# Patient Record
Sex: Female | Born: 1955 | Race: White | Hispanic: No | Marital: Single | Smoking: Never smoker
Health system: Southern US, Community
[De-identification: ages and names within clinical notes are randomized; demographics above are authoritative.]

## PROBLEM LIST (undated history)

## (undated) DIAGNOSIS — E785 Hyperlipidemia, unspecified: Secondary | ICD-10-CM

## (undated) HISTORY — PX: CYSTECTOMY: SUR359

## (undated) HISTORY — DX: Hyperlipidemia, unspecified: E78.5

---

## 1999-10-27 ENCOUNTER — Encounter: Payer: Self-pay | Admitting: Gynecology

## 1999-10-27 ENCOUNTER — Encounter: Admission: RE | Admit: 1999-10-27 | Discharge: 1999-10-27 | Payer: Self-pay | Admitting: Gynecology

## 2000-10-08 ENCOUNTER — Other Ambulatory Visit: Admission: RE | Admit: 2000-10-08 | Discharge: 2000-10-08 | Payer: Self-pay | Admitting: Gynecology

## 2000-10-31 ENCOUNTER — Encounter: Admission: RE | Admit: 2000-10-31 | Discharge: 2000-10-31 | Payer: Self-pay | Admitting: Gynecology

## 2000-10-31 ENCOUNTER — Encounter: Payer: Self-pay | Admitting: Gynecology

## 2001-10-14 ENCOUNTER — Other Ambulatory Visit: Admission: RE | Admit: 2001-10-14 | Discharge: 2001-10-14 | Payer: Self-pay | Admitting: Gynecology

## 2001-11-05 ENCOUNTER — Encounter: Admission: RE | Admit: 2001-11-05 | Discharge: 2001-11-05 | Payer: Self-pay | Admitting: Gynecology

## 2001-11-05 ENCOUNTER — Encounter: Payer: Self-pay | Admitting: Gynecology

## 2002-01-02 ENCOUNTER — Other Ambulatory Visit: Admission: RE | Admit: 2002-01-02 | Discharge: 2002-01-02 | Payer: Self-pay | Admitting: Gynecology

## 2002-07-07 ENCOUNTER — Other Ambulatory Visit: Admission: RE | Admit: 2002-07-07 | Discharge: 2002-07-07 | Payer: Self-pay | Admitting: Gynecology

## 2003-01-15 ENCOUNTER — Other Ambulatory Visit: Admission: RE | Admit: 2003-01-15 | Discharge: 2003-01-15 | Payer: Self-pay | Admitting: Gynecology

## 2003-02-10 ENCOUNTER — Encounter: Payer: Self-pay | Admitting: Gynecology

## 2003-02-10 ENCOUNTER — Encounter: Admission: RE | Admit: 2003-02-10 | Discharge: 2003-02-10 | Payer: Self-pay | Admitting: Gynecology

## 2003-02-18 ENCOUNTER — Other Ambulatory Visit: Admission: RE | Admit: 2003-02-18 | Discharge: 2003-02-18 | Payer: Self-pay | Admitting: Gynecology

## 2004-02-04 ENCOUNTER — Other Ambulatory Visit: Admission: RE | Admit: 2004-02-04 | Discharge: 2004-02-04 | Payer: Self-pay | Admitting: Gynecology

## 2004-03-16 ENCOUNTER — Encounter: Admission: RE | Admit: 2004-03-16 | Discharge: 2004-03-16 | Payer: Self-pay | Admitting: Gynecology

## 2004-10-13 ENCOUNTER — Other Ambulatory Visit: Admission: RE | Admit: 2004-10-13 | Discharge: 2004-10-13 | Payer: Self-pay | Admitting: Gynecology

## 2005-03-30 ENCOUNTER — Other Ambulatory Visit: Admission: RE | Admit: 2005-03-30 | Discharge: 2005-03-30 | Payer: Self-pay | Admitting: Gynecology

## 2005-05-04 ENCOUNTER — Encounter: Admission: RE | Admit: 2005-05-04 | Discharge: 2005-05-04 | Payer: Self-pay | Admitting: Gynecology

## 2006-04-10 ENCOUNTER — Other Ambulatory Visit: Admission: RE | Admit: 2006-04-10 | Discharge: 2006-04-10 | Payer: Self-pay | Admitting: Gynecology

## 2006-05-09 ENCOUNTER — Encounter: Admission: RE | Admit: 2006-05-09 | Discharge: 2006-05-09 | Payer: Self-pay | Admitting: Gynecology

## 2007-05-13 ENCOUNTER — Encounter: Admission: RE | Admit: 2007-05-13 | Discharge: 2007-05-13 | Payer: Self-pay | Admitting: Gynecology

## 2007-05-14 ENCOUNTER — Other Ambulatory Visit: Admission: RE | Admit: 2007-05-14 | Discharge: 2007-05-14 | Payer: Self-pay | Admitting: Gynecology

## 2008-05-26 ENCOUNTER — Encounter: Admission: RE | Admit: 2008-05-26 | Discharge: 2008-05-26 | Payer: Self-pay | Admitting: Gynecology

## 2008-06-02 ENCOUNTER — Emergency Department (HOSPITAL_COMMUNITY): Admission: EM | Admit: 2008-06-02 | Discharge: 2008-06-02 | Payer: Self-pay | Admitting: Emergency Medicine

## 2009-06-10 ENCOUNTER — Encounter: Admission: RE | Admit: 2009-06-10 | Discharge: 2009-06-10 | Payer: Self-pay | Admitting: Gynecology

## 2009-12-16 ENCOUNTER — Encounter: Admission: RE | Admit: 2009-12-16 | Discharge: 2009-12-16 | Payer: Self-pay | Admitting: Internal Medicine

## 2010-07-06 ENCOUNTER — Encounter: Admission: RE | Admit: 2010-07-06 | Discharge: 2010-07-06 | Payer: Self-pay | Admitting: Gynecology

## 2010-08-16 IMAGING — CT CT HEAD W/O CM
2 series · 16 of 30 positions shown, 18 images · non-contrast
Comparison: None

CLINICAL DATA: Dizziness.  Head injury.

CT HEAD WITHOUT CONTRAST
TECHNIQUE: Contiguous axial images were obtained from the base of
the skull through the vertex without contrast.

[Series 2: head w/o · axial · non-contrast · 0.43mm/px · z∈[+178,+292]mm · 8 of 30 slices shown, 10 images]
[im 4/30  brain]
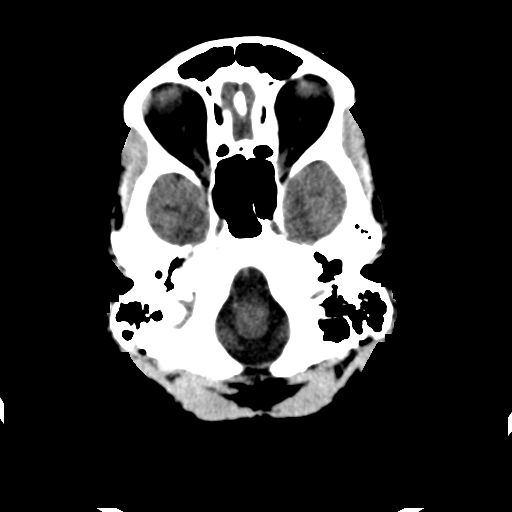
[im 4/30  bone]
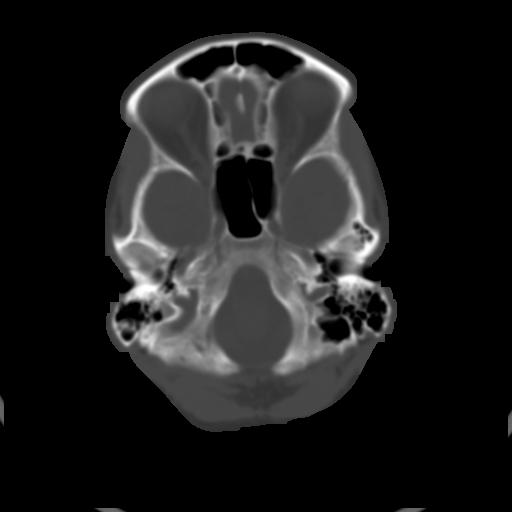
[im 7/30  brain]
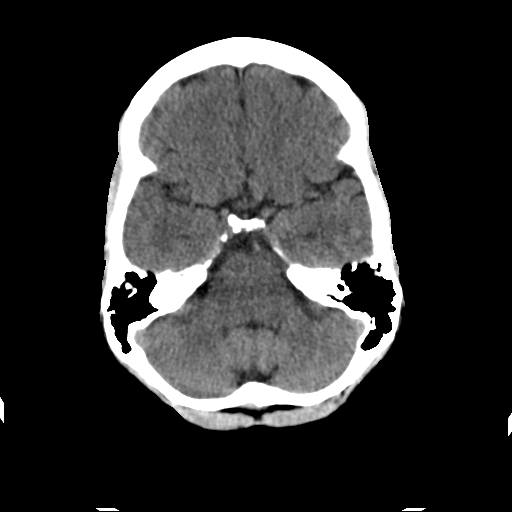
[im 10/30  brain]
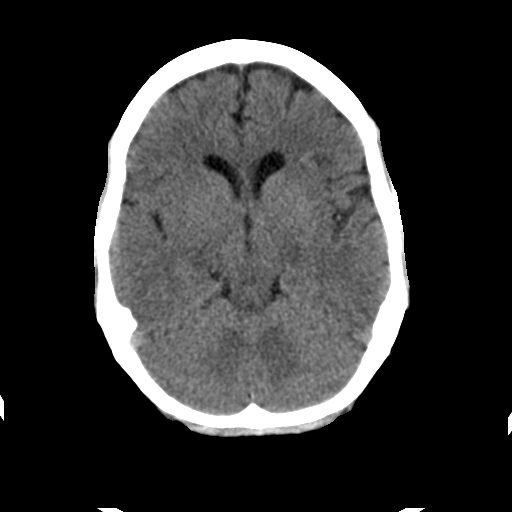
[im 13/30  brain]
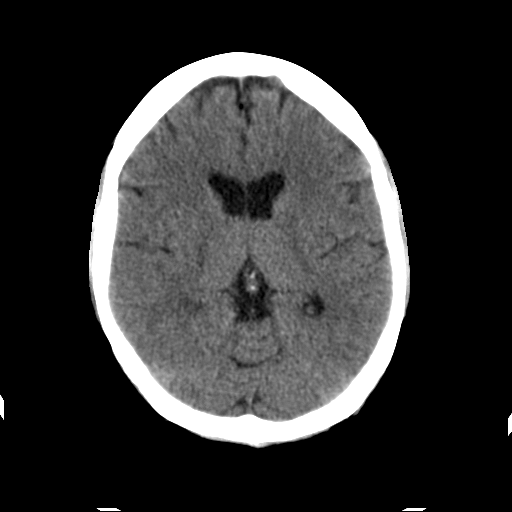
[im 17/30  brain]
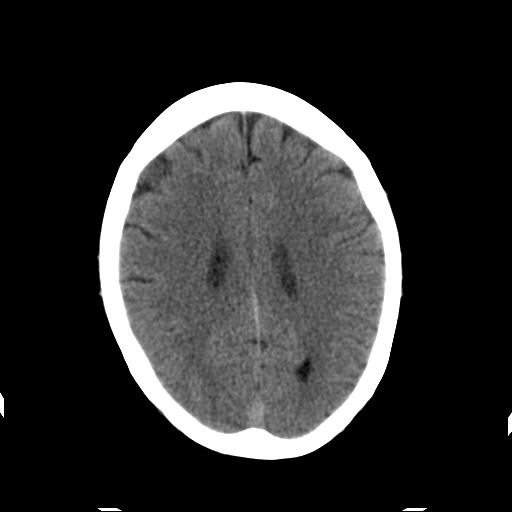
[im 17/30  bone]
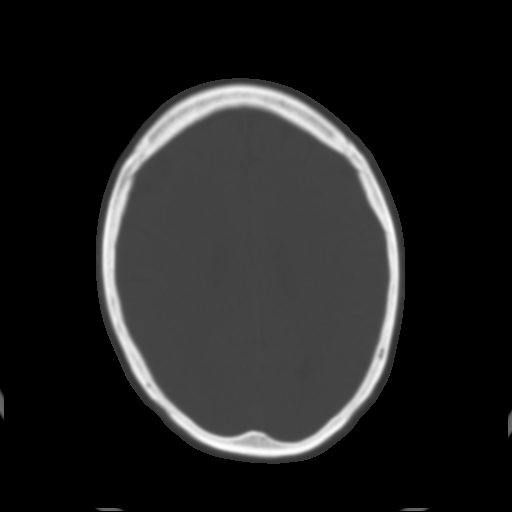
[im 20/30  brain]
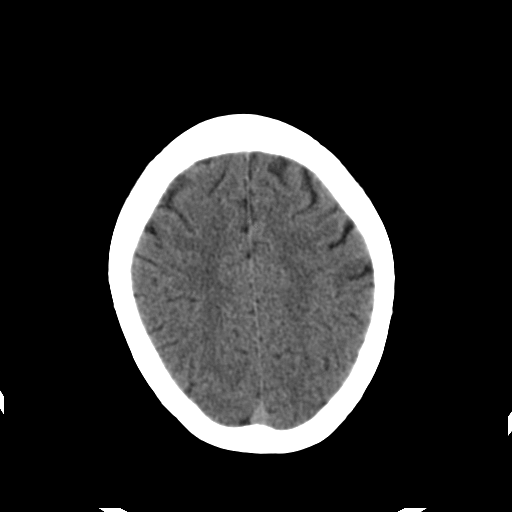
[im 23/30  brain]
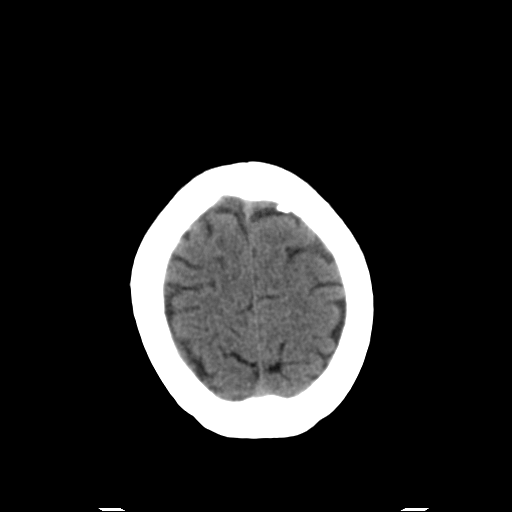
[im 26/30  brain]
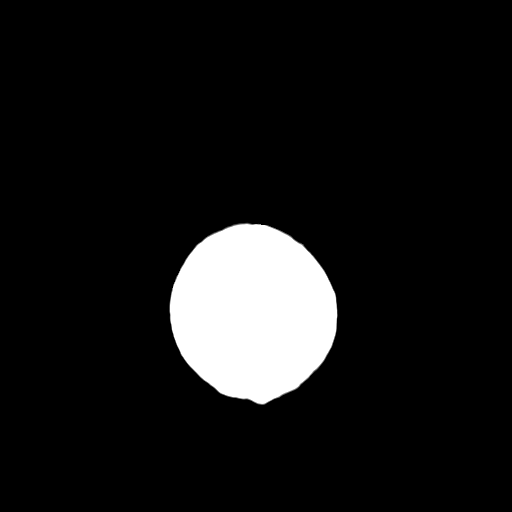

[Series 3: head bone · axial · 0.43mm/px · z∈[+177,+295]mm · 8 of 60 slices shown]
[im 7/60  bone]
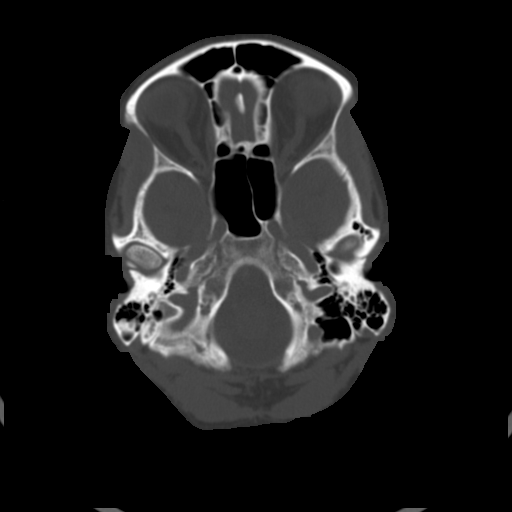
[im 13/60  bone]
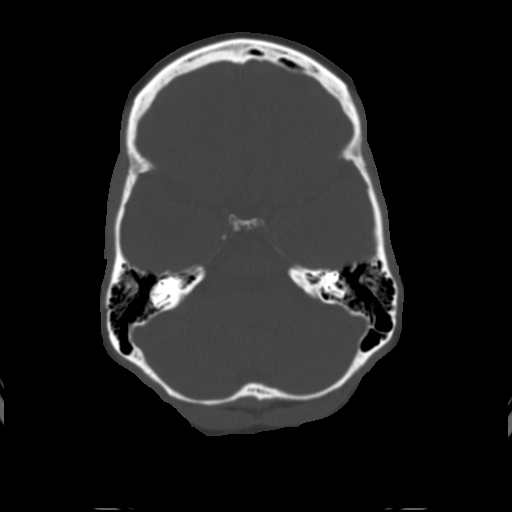
[im 19/60  bone]
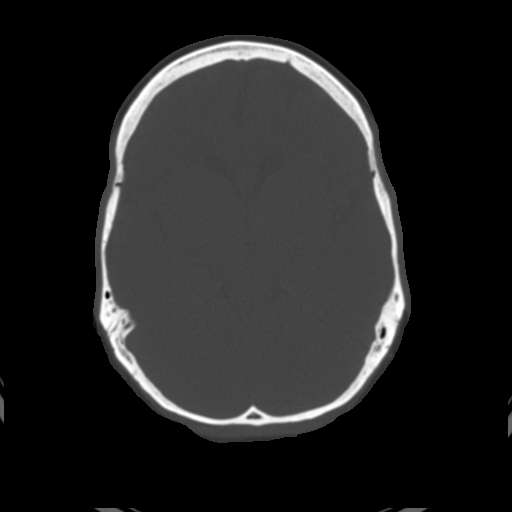
[im 25/60  bone]
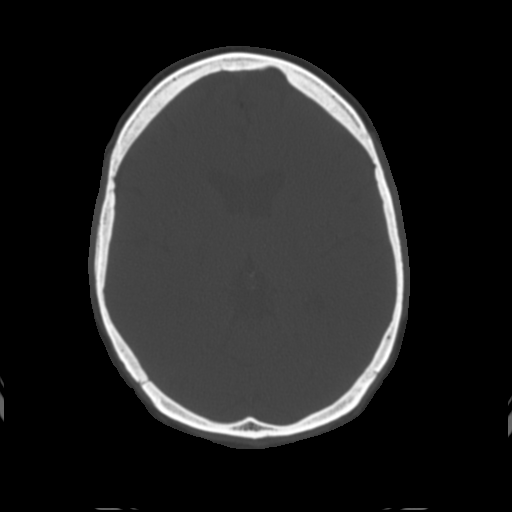
[im 35/60  bone]
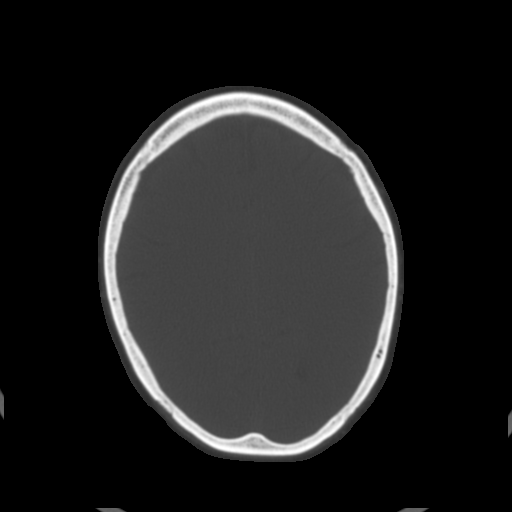
[im 41/60  bone]
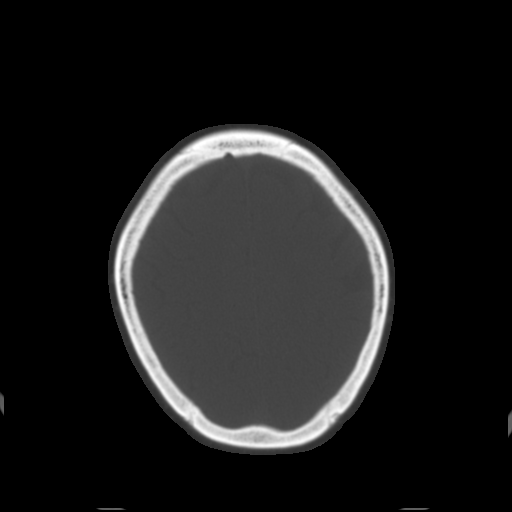
[im 47/60  bone]
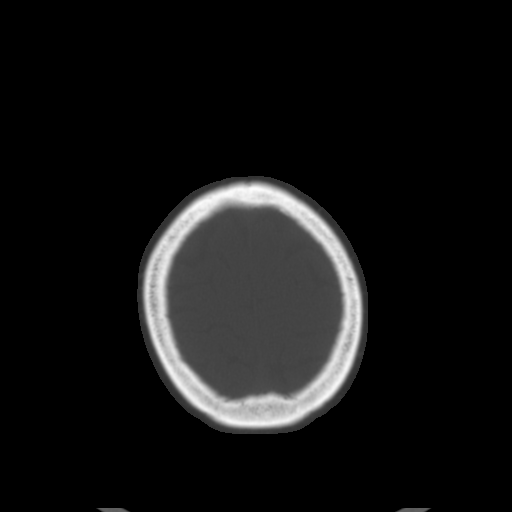
[im 53/60  bone]
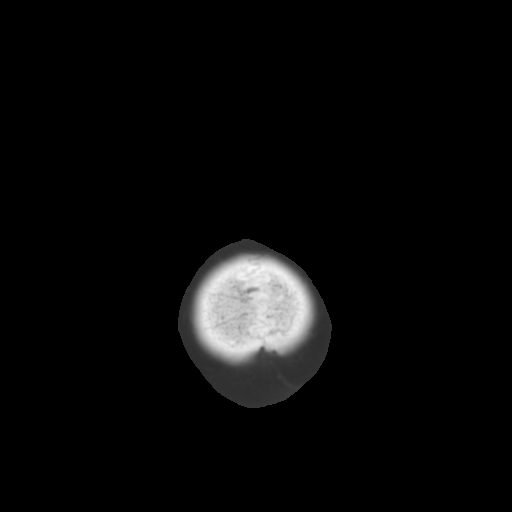

[16 of 30 positions shown; findings below may reference images not displayed]

FINDINGS: The brain has a normal appearance without evidence of
atrophy, old or acute infarction, mass lesion, hemorrhage,
hydrocephalus or extra-axial collection.  No skull fracture.  No
fluid in the sinuses, middle ears or mastoids.
IMPRESSION: Normal head CT

## 2011-05-09 NOTE — Consult Note (Signed)
Breanna Swanson                ACCOUNT NO.:  [id number]   MEDICAL RECORD NO.:  [id number]          PATIENT TYPE:  EMS   LOCATION:  [facility]                         FACILITY:  [facility]   PHYSICIAN:  Dionne Ano. Gramig III, M.D.DATE OF BIRTH:  [birth year]   DATE OF CONSULTATION:  06/02/2008  DATE OF DISCHARGE:                                 CONSULTATION   HISTORY OF PRESENT ILLNESS:  [name] was seen in [facility] upon referral from [facility] on [facility].  This patient is a very pleasant female who was referred for evaluation  of her index finger.  The patient had a thorn/puncture type injury to  her finger 5-7 days ago.  Last night, she was having great difficulty  with pain and was referred.  We have performed a thorough and thoughtful  evaluation.  She was referred by [facility].   PAST MEDICAL HISTORY:  Hypercholesterolemia and depression.   PAST SURGICAL HISTORY:  Cyst removal x2.   FAMILY HISTORY:  Reviewed.   SOCIAL HISTORY:  She is single.  Does not smoke or drink.  She is  employed by the [facility].   DRUG ALLERGIES:  PENICILLIN AND AUGMENTIN.   PHYSICAL EXAMINATION:  The finger has full range of motion.  No signs of  infection, __________ or neurovascular compromise.  Stable ligamentous  examination.  She has normal FDP, FDS, and extensor function.  No  canaval signs.  Her hand has a small area ulnarly where a puncture site  was noted; however, this is barely visible.  There is no felon abscess  or obvious infection.  Her x-rays are negative.   IMPRESSION:  Status post puncture injury, stable at present time.   PLAN:  We are going to cover her with an anti-inflammatory as well as  doxycycline and gave her 1 g of Ancef in the ER as well as update her  tetanus.  I must see her back in a week.  I feel she can resume her work  as tolerated, and I have given her dressing supplies to bandage area.  If she has any worsening problems she  will notify me.  We will continue  to keep a close eye on this, but I do not see any need for an I&D at  this juncture.  We have gone over these issues at length.  I appreciate  your kind referral.      Dionne Ano. Everlene Other, M.D.     Nash Mantis  D:  06/02/2008  T:  06/03/2008  Job:  [id number]   cc:   [facility]

## 2011-08-01 ENCOUNTER — Other Ambulatory Visit: Payer: Self-pay | Admitting: Gynecology

## 2011-08-01 DIAGNOSIS — Z1231 Encounter for screening mammogram for malignant neoplasm of breast: Secondary | ICD-10-CM

## 2011-08-09 ENCOUNTER — Ambulatory Visit
Admission: RE | Admit: 2011-08-09 | Discharge: 2011-08-09 | Disposition: A | Discharge status: Home / Self Care | Payer: PRIVATE HEALTH INSURANCE | Source: Ambulatory Visit | Attending: Gynecology | Admitting: Gynecology

## 2011-08-09 DIAGNOSIS — Z1231 Encounter for screening mammogram for malignant neoplasm of breast: Secondary | ICD-10-CM

## 2011-09-21 LAB — BASIC METABOLIC PANEL
Creatinine, Ser: 0.68
GFR calc Af Amer: >60
Glucose, Bld: 86
Sodium: 140

## 2011-09-21 LAB — CBC
HCT: 44.7
Hemoglobin: 15.3 — ABNORMAL HIGH
MCV: 94.7
Platelets: 217
RBC: 4.71

## 2011-09-21 LAB — DIFFERENTIAL
Basophils Absolute: 0.1
Neutro Abs: 3

## 2011-11-30 ENCOUNTER — Ambulatory Visit (INDEPENDENT_AMBULATORY_CARE_PROVIDER_SITE_OTHER): Payer: PRIVATE HEALTH INSURANCE

## 2011-11-30 DIAGNOSIS — R110 Nausea: Secondary | ICD-10-CM

## 2011-11-30 DIAGNOSIS — R51 Headache: Secondary | ICD-10-CM

## 2012-01-28 ENCOUNTER — Ambulatory Visit (INDEPENDENT_AMBULATORY_CARE_PROVIDER_SITE_OTHER): Payer: PRIVATE HEALTH INSURANCE | Admitting: Family Medicine

## 2012-01-28 DIAGNOSIS — M79609 Pain in unspecified limb: Secondary | ICD-10-CM

## 2012-01-28 DIAGNOSIS — S60459A Superficial foreign body of unspecified finger, initial encounter: Secondary | ICD-10-CM

## 2012-01-28 NOTE — Progress Notes (Signed)
  Subjective:    Patient ID: Breanna Swanson, female    DOB: 1956/07/09, 56 y.o.   MRN: [id number]  HPI 56 yo female cleaning tub and silver scraping of tub lodged in her finger.  Extracted part on her own with tweezers. Some still left in.  Very sore.  Last tetanus 2008.  Right index finger.    Review of Systems Negative except as per HPI     Objective:   Physical Exam Right index finger with small, visible foreign body embedded under skin.         Assessment & Plan:  Finger pain Foreign body in finger  Removed per procedure note. Tetanus already up to date.

## 2012-01-28 NOTE — Progress Notes (Signed)
Verbal consent obtained.  Metacarpal block with 3 cc 2% Lidocaine plain.  Scrubbed with soap and water.  Splinter foreceps used to extract FB en toto (metallic paint chip).  Cleansed and dressed.

## 2012-08-05 ENCOUNTER — Other Ambulatory Visit: Payer: Self-pay | Admitting: Gynecology

## 2012-08-05 DIAGNOSIS — Z1231 Encounter for screening mammogram for malignant neoplasm of breast: Secondary | ICD-10-CM

## 2012-08-13 ENCOUNTER — Ambulatory Visit
Admission: RE | Admit: 2012-08-13 | Discharge: 2012-08-13 | Disposition: A | Discharge status: Home / Self Care | Payer: PRIVATE HEALTH INSURANCE | Source: Ambulatory Visit | Attending: Gynecology | Admitting: Gynecology

## 2012-08-13 DIAGNOSIS — Z1231 Encounter for screening mammogram for malignant neoplasm of breast: Secondary | ICD-10-CM

## 2012-12-14 ENCOUNTER — Ambulatory Visit (INDEPENDENT_AMBULATORY_CARE_PROVIDER_SITE_OTHER): Payer: PRIVATE HEALTH INSURANCE | Admitting: Physician Assistant

## 2012-12-14 VITALS — BP 128/80 | HR 68 | Temp 98.6°F | Resp 16 | Ht 61.0 in | Wt 138.0 lb

## 2012-12-14 DIAGNOSIS — R51 Headache: Secondary | ICD-10-CM

## 2012-12-14 MED ORDER — KETOROLAC TROMETHAMINE 60 MG/2ML IM SOLN
60.0000 mg | Freq: Once | INTRAMUSCULAR | Status: AC
  Administered 2012-12-14: 60 mg via INTRAMUSCULAR

## 2012-12-15 NOTE — Progress Notes (Signed)
  Subjective:    Patient ID: Bre[facility] Swanson, female    DOB: [birth year], 56 y.o.   MRN: [id number]  HPI  56 yr old CF presents with migraine headache.  She has had headaches like this before.  She has photophobia and the headache is all over.  She took an excedrin migraine this morning without relief.  She is nauseated but she has not vomited.  This headache is fairly typical for her except that it hasn't gone away.    Review of Systems  All other systems reviewed and are negative.       Objective:   Physical Exam  Nursing note and vitals reviewed. Constitutional: She is oriented to person, place, and time. She appears well-developed and well-nourished.       Sitting in dark room, holding forehead in her hands.  HENT:  Head: Normocephalic and atraumatic.  Right Ear: External ear normal.  Left Ear: External ear normal.  Eyes: Conjunctivae normal and EOM are normal. Pupils are equal, round, and reactive to light.       Fundi benign  Cardiovascular: Normal rate, regular rhythm and normal heart sounds.   Pulmonary/Chest: Effort normal and breath sounds normal.  Neurological: She is alert and oriented to person, place, and time. No cranial nerve deficit.  Skin: Skin is warm and dry.  Psychiatric: She has a normal mood and affect. Her behavior is normal. Thought content normal.        Assessment & Plan:  Migraine-toradol 60 IM now.  Also gave her #7 phenergan from our stock bottle.  She can take 1-2 every 6 hrs as needed for nausea and headache.  She is to go to ER if this doesn't help or headache worsens.  Patient agrees.

## 2013-05-21 ENCOUNTER — Ambulatory Visit (INDEPENDENT_AMBULATORY_CARE_PROVIDER_SITE_OTHER): Payer: PRIVATE HEALTH INSURANCE | Admitting: Family Medicine

## 2013-05-21 VITALS — BP 128/88 | HR 70 | Temp 97.7°F | Resp 16 | Ht 62.0 in | Wt 161.0 lb

## 2013-05-21 DIAGNOSIS — G43909 Migraine, unspecified, not intractable, without status migrainosus: Secondary | ICD-10-CM

## 2013-05-21 MED ORDER — KETOROLAC TROMETHAMINE 60 MG/2ML IM SOLN
60.0000 mg | Freq: Once | INTRAMUSCULAR | Status: AC
  Administered 2013-05-21: 60 mg via INTRAMUSCULAR

## 2013-05-21 MED ORDER — TRAMADOL HCL 50 MG PO TABS: 50.0000 mg | ORAL_TABLET | Freq: Four times a day (QID) | ORAL | Status: DC | PRN

## 2013-05-21 MED ORDER — PROMETHAZINE HCL 25 MG/ML IJ SOLN
25.0000 mg | Freq: Once | INTRAMUSCULAR | Status: AC
  Administered 2013-05-21: 25 mg via INTRAMUSCULAR

## 2013-05-21 MED ORDER — PROMETHAZINE HCL 25 MG PO TABS: 25.0000 mg | ORAL_TABLET | Freq: Four times a day (QID) | ORAL | Status: DC | PRN

## 2013-05-21 NOTE — Progress Notes (Signed)
Subjective: 57 year old lady who came in with a history of having headaches since about Sunday. She was taking care of some of her mother's affairs this weekend. Her mother passed away several months ago, and is pretty stressful on her. She has had a severe headache since. She did work yesterday despite the headache. 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 She has photophobia and sensitivity to sound. She had some nausea and vomiting this weekend.  Objective: Patient is in obvious pain from her headache. This is fairly typical of her migraines, which are not real frequent. Her TMs are normal. Eyes PERRLA. EOMs intact. Neck supple. Chest clear. Heart regular.  Assessment: Migraine  Plan: Ketorolac and promethazine.  Prescribed Toradol and promethazine.  Return if worse.

## 2013-05-21 NOTE — Patient Instructions (Addendum)
Go home and rest after today's injection  Use the Phenergan as needed for nausea and the tramadol as needed for headaches. Return if worse.   Migraine Headache A migraine headache is an intense, throbbing pain on one or both sides of your head. A migraine can last for 30 minutes to several hours. CAUSES  The exact cause of a migraine headache is not always known. However, a migraine may be caused when nerves in the brain become irritated and release chemicals that cause inflammation. This causes pain. SYMPTOMS  Pain on one or both sides of your head.  Pulsating or throbbing pain.  Severe pain that prevents daily activities.  Pain that is aggravated by any physical activity.  Nausea, vomiting, or both.  Dizziness.  Pain with exposure to bright lights, loud noises, or activity.  General sensitivity to bright lights, loud noises, or smells. Before you get a migraine, you may get warning signs that a migraine is coming (aura). An aura may include:  Seeing flashing lights.  Seeing bright spots, halos, or zig-zag lines.  Having tunnel vision or blurred vision.  Having feelings of numbness or tingling.  Having trouble talking.  Having muscle weakness. MIGRAINE TRIGGERS  Alcohol.  Smoking.  Stress.  Menstruation.  Aged cheeses.  Foods or drinks that contain nitrates, glutamate, aspartame, or tyramine.  Lack of sleep.  Chocolate.  Caffeine.  Hunger.  Physical exertion.  Fatigue.  Medicines used to treat chest pain (nitroglycerine), birth control pills, estrogen, and some blood pressure medicines. DIAGNOSIS  A migraine headache is often diagnosed based on:  Symptoms.  Physical examination.  A CT scan or MRI of your head. TREATMENT Medicines may be given for pain and nausea. Medicines can also be given to help prevent recurrent migraines.  HOME CARE INSTRUCTIONS  Only take over-the-counter or prescription medicines for pain or discomfort as directed  by your caregiver. The use of long-term narcotics is not recommended.  Lie down in a dark, quiet room when you have a migraine.  Keep a journal to find out what may trigger your migraine headaches. For example, write down:  What you eat and drink.  How much sleep you get.  Any change to your diet or medicines.  Limit alcohol consumption.  Quit smoking if you smoke.  Get 7 to 9 hours of sleep, or as recommended by your caregiver.  Limit stress.  Keep lights dim if bright lights bother you and make your migraines worse. SEEK IMMEDIATE MEDICAL CARE IF:   Your migraine becomes severe.  You have a fever.  You have a stiff neck.  You have vision loss.  You have muscular weakness or loss of muscle control.  You start losing your balance or have trouble walking.  You feel faint or pass out.  You have severe symptoms that are different from your first symptoms. MAKE SURE YOU:   Understand these instructions.  Will watch your condition.  Will get help right away if you are not doing well or get worse. Document Released: 12/11/2005 Document Revised: 03/04/2012 Document Reviewed: 12/01/2011 [facility] Patient Information 2014 [place], [place].

## 2013-08-18 ENCOUNTER — Other Ambulatory Visit: Payer: Self-pay

## 2013-08-26 ENCOUNTER — Other Ambulatory Visit: Payer: Self-pay

## 2013-08-26 DIAGNOSIS — Z1231 Encounter for screening mammogram for malignant neoplasm of breast: Secondary | ICD-10-CM

## 2013-09-19 ENCOUNTER — Ambulatory Visit
Admission: RE | Admit: 2013-09-19 | Discharge: 2013-09-19 | Disposition: A | Discharge status: Home / Self Care | Payer: PRIVATE HEALTH INSURANCE | Source: Ambulatory Visit

## 2013-09-19 DIAGNOSIS — Z1231 Encounter for screening mammogram for malignant neoplasm of breast: Secondary | ICD-10-CM

## 2014-01-20 ENCOUNTER — Ambulatory Visit (INDEPENDENT_AMBULATORY_CARE_PROVIDER_SITE_OTHER): Payer: PRIVATE HEALTH INSURANCE | Admitting: Physician Assistant

## 2014-01-20 VITALS — BP 128/82 | HR 87 | Temp 98.7°F | Resp 18 | Ht 62.0 in | Wt 164.0 lb

## 2014-01-20 DIAGNOSIS — R059 Cough, unspecified: Secondary | ICD-10-CM

## 2014-01-20 DIAGNOSIS — R05 Cough: Secondary | ICD-10-CM

## 2014-01-20 DIAGNOSIS — R6889 Other general symptoms and signs: Secondary | ICD-10-CM

## 2014-01-20 LAB — POCT INFLUENZA A/B
INFLUENZA B, POC: NEGATIVE
Influenza A, POC: NEGATIVE

## 2014-01-20 MED ORDER — HYDROCODONE-HOMATROPINE 5-1.5 MG/5ML PO SYRP: 5.0000 mL | ORAL_SOLUTION | Freq: Three times a day (TID) | ORAL | Status: DC | PRN

## 2014-01-20 MED ORDER — OSELTAMIVIR PHOSPHATE 75 MG PO CAPS: 75.0000 mg | ORAL_CAPSULE | Freq: Two times a day (BID) | ORAL | Status: DC

## 2014-01-20 MED ORDER — GUAIFENESIN ER 1200 MG PO TB12: 1.0000 | ORAL_TABLET | Freq: Two times a day (BID) | ORAL | Status: DC | PRN

## 2014-01-20 NOTE — Progress Notes (Signed)
   Subjective:    Patient ID: [facility], female    DOB: 07/25/1956, 58 y.o.   MRN: [id number]  HPI  Patient presents to clinic with flu-like symptoms beginning yesterday morning.  Reports dry cough, sore throat, headache, rhinorrhea, body aches, chills. Symptoms began all of a sudden.  Cough is deep in her chest - hard to take deep breath because that causes a cough. Cough does not worsen at night.   Denies chest congestion, ear pain and fever.  Has taken Nyquil which seems to have helped.  Has had flu shot this year. Several co-workers have been diagnosed with the flu.  No hx of asthma and non-smoker.   Review of Systems  Constitutional: Positive for appetite change (increased ).  Cardiovascular: Negative for palpitations.  Gastrointestinal: Negative for nausea, vomiting, abdominal pain, diarrhea, constipation and blood in stool.  Genitourinary: Negative for dysuria and hematuria.  Neurological: Negative for dizziness and light-headedness.       Objective:   Physical Exam  Constitutional: She is oriented to person, place, and time. She appears well-developed and well-nourished.  HENT:  Head: Normocephalic and atraumatic.  Eyes: Conjunctivae are normal.  Cardiovascular: Normal rate, regular rhythm and normal heart sounds.   Pulmonary/Chest: Effort normal and breath sounds normal.  Pt is concentrating to prevent cough with inspiration and inspiration may be slightly less than normal as a result.  Neurological: She is alert and oriented to person, place, and time.  Skin: Skin is warm and dry.  Psychiatric: She has a normal mood and affect. Her behavior is normal. Judgment and thought content normal.   Results for orders placed in visit on 01/20/14  POCT INFLUENZA A/B      Result Value Range   Influenza A, POC Negative     Influenza B, POC Negative          Assessment & Plan:    1. Cough Take Hycodan every 8 hours as needed for cough - do not drive while taking this  medication as it may make you drowsy.  - POCT Influenza A/B - Guaifenesin (MUCINEX MAXIMUM STRENGTH) 1200 MG TB12; Take 1 tablet (1,200 mg total) by mouth every 12 (twelve) hours as needed.  Dispense: 14 tablet; Refill: 1 - HYDROcodone-homatropine (HYCODAN) 5-1.5 MG/5ML syrup; Take 5 mLs by mouth every 8 (eight) hours as needed for cough.  Dispense: 120 mL; Refill: 0  2. Flu-like symptoms Flu test was negative, however, due to patients myalgias, cough and congestion will still treat with Tamiflu. Take Mucinex to thin mucus. Rest and push fluids.  - oseltamivir (TAMIFLU) 75 MG capsule; Take 1 capsule (75 mg total) by mouth 2 (two) times daily.  Dispense: 10 capsule; Refill: 0

## 2014-01-20 NOTE — Patient Instructions (Signed)
Take Mucinex daily to thin mucus. Take Hycodan for cough - do not drive or operate machinery while taking this medication. Rest and drink plenty of fluids.

## 2014-01-20 NOTE — Progress Notes (Signed)
I was directly involved with the patient's care and agree with the physical, diagnosis and treatment plan.  

## 2014-06-01 ENCOUNTER — Ambulatory Visit (INDEPENDENT_AMBULATORY_CARE_PROVIDER_SITE_OTHER): Payer: PRIVATE HEALTH INSURANCE | Admitting: Emergency Medicine

## 2014-06-01 VITALS — BP 104/78 | HR 77 | Temp 97.6°F | Resp 18 | Ht 61.0 in | Wt 157.0 lb

## 2014-06-01 DIAGNOSIS — G43909 Migraine, unspecified, not intractable, without status migrainosus: Secondary | ICD-10-CM

## 2014-06-01 MED ORDER — NALBUPHINE HCL 20 MG/ML IJ SOLN
20.0000 mg | Freq: Once | INTRAMUSCULAR | Status: AC
  Administered 2014-06-01: 20 mg via INTRAMUSCULAR

## 2014-06-01 MED ORDER — PROMETHAZINE HCL 25 MG/ML IJ SOLN
50.0000 mg | Freq: Once | INTRAMUSCULAR | Status: AC
  Administered 2014-06-01: 50 mg via INTRAMUSCULAR

## 2014-06-01 NOTE — Progress Notes (Signed)
[facility] [address], [place] [address] [phone]  Date:  06/01/2014   Name:  Breanna Swanson   DOB:  August 16, 1956   MRN:  [id number]  PCP:  No primary provider on file.    Chief Complaint: Headache and Migraine   History of Present Illness:  [facility] is a 58 y.o. very pleasant female patient who presents with the following:  Patient has a history of migraine headaches.  This headache started yesterday at 0500.  She took two doses of relpax yesterday for headache with partial relief.  Today she awakened with a severe headache, photophobia and sensitivitiy to sound and nausea.  No fever, chills, no antecedent illness or injury.  She gets a severe headache such as this annually.  This is characteristic of her usual headache.  No improvement with over the counter medications or other home remedies. Denies other complaint or health concern today.   There are no active problems to display for this patient.   Past Medical History  Diagnosis Date  . Hyperlipidemia     Past Surgical History  Procedure Laterality Date  . Cystectomy      History  Substance Use Topics  . Smoking status: Never Smoker   . Smokeless tobacco: Never Used  . Alcohol Use: Yes     Comment: social     No family history on file.  Allergies  Allergen Reactions  . Amoxicillin-Pot Clavulanate Rash    Medication list has been reviewed and updated.  Current Outpatient Prescriptions on File Prior to Visit  Medication Sig Dispense Refill  . atorvastatin (LIPITOR) 10 MG tablet Take 5 mg by mouth daily.      Marland Kitchen buPROPion (WELLBUTRIN) 100 MG tablet Take 100 mg by mouth 2 (two) times daily.      Marland Kitchen eletriptan (RELPAX) 40 MG tablet Take 40 mg by mouth as needed for migraine or headache. One tablet by mouth at onset of headache. May repeat in 2 hours if headache persists or recurs.      . sertraline (ZOLOFT) 100 MG tablet Take 100 mg by mouth daily.      . Guaifenesin (MUCINEX  MAXIMUM STRENGTH) 1200 MG TB12 Take 1 tablet (1,200 mg total) by mouth every 12 (twelve) hours as needed.  14 tablet  1  . HYDROcodone-homatropine (HYCODAN) 5-1.5 MG/5ML syrup Take 5 mLs by mouth every 8 (eight) hours as needed for cough.  120 mL  0  . ibandronate (BONIVA) 150 MG tablet Take 150 mg by mouth every 30 (thirty) days. Take in the morning with a full glass of water, on an empty stomach, and do not take anything else by mouth or lie down for the next 30 min.      Marland Kitchen oseltamivir (TAMIFLU) 75 MG capsule Take 1 capsule (75 mg total) by mouth 2 (two) times daily.  10 capsule  0  . promethazine (PHENERGAN) 25 MG tablet Take 1 tablet (25 mg total) by mouth every 6 (six) hours as needed for nausea.  20 tablet  0  . traMADol (ULTRAM) 50 MG tablet Take 1 tablet (50 mg total) by mouth every 6 (six) hours as needed for pain.  20 tablet  0   No current facility-administered medications on file prior to visit.    Review of Systems:  As per HPI, otherwise negative.    Physical Examination: Filed Vitals:   06/01/14 1806  BP: 104/78  Pulse: 77  Temp: 97.6 F (36.4  C)  Resp: 18   Filed Vitals:   06/01/14 1806  Height: 5\' 1"  (1.549 m)  Weight: 157 lb (71.215 kg)   Body mass index is 29.68 kg/(m^2). Ideal Body Weight: Weight in (lb) to have BMI = 25: 132  GEN: WDWN, moderate distress, Non-toxic, A & O x 3 HEENT: Atraumatic, Normocephalic. Neck supple. No masses, No LAD. Ears and Nose: No external deformity. CV: RRR, No M/G/R. No JVD. No thrill. No extra heart sounds. PULM: CTA B, no wheezes, crackles, rhonchi. No retractions. No resp. distress. No accessory muscle use. ABD: S, NT, ND, +BS. No rebound. No HSM. EXTR: No c/c/e NEURO Normal gait.  PSYCH: Normally interactive. Conversant. Not depressed or anxious appearing.  Calm demeanor.    Assessment and Plan: Migraine nubain 20 mg IM Phenergan 50 IM   Signed,  Phillips OdorJeffery Anderson, MD

## 2014-06-01 NOTE — Patient Instructions (Signed)
Migraine Headache A migraine headache is an intense, throbbing pain on one or both sides of your head. A migraine can last for 30 minutes to several hours. CAUSES  The exact cause of a migraine headache is not always known. However, a migraine may be caused when nerves in the brain become irritated and release chemicals that cause inflammation. This causes pain. Certain things may also trigger migraines, such as:  Alcohol.  Smoking.  Stress.  Menstruation.  Aged cheeses.  Foods or drinks that contain nitrates, glutamate, aspartame, or tyramine.  Lack of sleep.  Chocolate.  Caffeine.  Hunger.  Physical exertion.  Fatigue.  Medicines used to treat chest pain (nitroglycerine), birth control pills, estrogen, and some blood pressure medicines. SIGNS AND SYMPTOMS  Pain on one or both sides of your head.  Pulsating or throbbing pain.  Severe pain that prevents daily activities.  Pain that is aggravated by any physical activity.  Nausea, vomiting, or both.  Dizziness.  Pain with exposure to bright lights, loud noises, or activity.  General sensitivity to bright lights, loud noises, or smells. Before you get a migraine, you may get warning signs that a migraine is coming (aura). An aura may include:  Seeing flashing lights.  Seeing bright spots, halos, or zig-zag lines.  Having tunnel vision or blurred vision.  Having feelings of numbness or tingling.  Having trouble talking.  Having muscle weakness. DIAGNOSIS  A migraine headache is often diagnosed based on:  Symptoms.  Physical exam.  A CT scan or MRI of your head. These imaging tests cannot diagnose migraines, but they can help rule out other causes of headaches. TREATMENT Medicines may be given for pain and nausea. Medicines can also be given to help prevent recurrent migraines.  HOME CARE INSTRUCTIONS  Only take over-the-counter or prescription medicines for pain or discomfort as directed by your  health care provider. The use of long-term narcotics is not recommended.  Lie down in a dark, quiet room when you have a migraine.  Keep a journal to find out what may trigger your migraine headaches. For example, write down:  What you eat and drink.  How much sleep you get.  Any change to your diet or medicines.  Limit alcohol consumption.  Quit smoking if you smoke.  Get 7 9 hours of sleep, or as recommended by your health care provider.  Limit stress.  Keep lights dim if bright lights bother you and make your migraines worse. SEEK IMMEDIATE MEDICAL CARE IF:   Your migraine becomes severe.  You have a fever.  You have a stiff neck.  You have vision loss.  You have muscular weakness or loss of muscle control.  You start losing your balance or have trouble walking.  You feel faint or pass out.  You have severe symptoms that are different from your first symptoms. MAKE SURE YOU:   Understand these instructions.  Will watch your condition.  Will get help right away if you are not doing well or get worse. Document Released: 12/11/2005 Document Revised: 10/01/2013 Document Reviewed: 08/18/2013 [employer] Patient Information 2014 [employer].  

## 2014-09-07 ENCOUNTER — Other Ambulatory Visit: Payer: Self-pay

## 2014-09-07 DIAGNOSIS — Z1231 Encounter for screening mammogram for malignant neoplasm of breast: Secondary | ICD-10-CM

## 2014-09-24 ENCOUNTER — Ambulatory Visit: Payer: PRIVATE HEALTH INSURANCE

## 2015-01-14 ENCOUNTER — Ambulatory Visit (INDEPENDENT_AMBULATORY_CARE_PROVIDER_SITE_OTHER): Payer: PRIVATE HEALTH INSURANCE | Admitting: Physician Assistant

## 2015-01-14 VITALS — BP 118/80 | HR 78 | Temp 99.0°F | Resp 18 | Ht 62.25 in | Wt 156.0 lb

## 2015-01-14 DIAGNOSIS — G43009 Migraine without aura, not intractable, without status migrainosus: Secondary | ICD-10-CM

## 2015-01-14 DIAGNOSIS — R112 Nausea with vomiting, unspecified: Secondary | ICD-10-CM

## 2015-01-14 DIAGNOSIS — G43909 Migraine, unspecified, not intractable, without status migrainosus: Secondary | ICD-10-CM | POA: Insufficient documentation

## 2015-01-14 MED ORDER — KETOROLAC TROMETHAMINE 60 MG/2ML IM SOLN
60.0000 mg | Freq: Once | INTRAMUSCULAR | Status: AC
  Administered 2015-01-14: 60 mg via INTRAMUSCULAR

## 2015-01-14 MED ORDER — ONDANSETRON 4 MG PO TBDP
8.0000 mg | ORAL_TABLET | Freq: Once | ORAL | Status: AC
  Administered 2015-01-14: 8 mg via ORAL

## 2015-01-14 MED ORDER — ONDANSETRON 4 MG PO TBDP: 4.0000 mg | ORAL_TABLET | Freq: Three times a day (TID) | ORAL | Status: AC | PRN

## 2015-01-14 NOTE — Patient Instructions (Signed)
i have sent nausea medication to the pharmacy for future migraine and nausea.  Please use as you need to.  It is the kind that dissolves under your tongue.

## 2015-01-14 NOTE — Progress Notes (Signed)
   Subjective:    Patient ID: [facility], female    DOB: 01/28/1956, 59 y.o.   MRN: [id number]  HPI Pt presents to clinic with a 2 day h/o migraine.  She woke up yesterday am with the headache.  She took her Relpax as instructed as soon as she woke up but she got no response so she did not take the 2nd dose.  This is her typical migraine.  It is left sided with nausea and vomiting and photophobia.  She is having no weakness or confusion.  Review of Systems  Eyes: Positive for photophobia.  Gastrointestinal: Positive for nausea and vomiting.  Neurological: Positive for headaches.       Objective:   Physical Exam  Constitutional: She is oriented to person, place, and time. She appears well-developed and well-nourished.  BP 118/80 mmHg  Pulse 78  Temp(Src) 99 F (37.2 C) (Oral)  Resp 18  Ht 5' 2.25" (1.581 m)  Wt 156 lb (70.761 kg)  BMI 28.31 kg/m2  SpO2 98%   HENT:  Head: Normocephalic and atraumatic.  Right Ear: Hearing, tympanic membrane, external ear and ear canal normal.  Left Ear: Hearing, tympanic membrane, external ear and ear canal normal.  Mouth/Throat: Uvula is midline, oropharynx is clear and moist and mucous membranes are normal.  Eyes: Conjunctivae and EOM are normal. Pupils are equal, round, and reactive to light.  Neck: Normal range of motion. Neck supple.  Cardiovascular: Normal rate, regular rhythm and normal heart sounds.   No murmur heard. Pulmonary/Chest: Effort normal and breath sounds normal. She has no wheezes.  Neurological: She is alert and oriented to person, place, and time. She has normal strength and normal reflexes. No sensory deficit.  Reflex Scores:      Bicep reflexes are 2+ on the right side and 2+ on the left side.      Brachioradialis reflexes are 2+ on the right side and 2+ on the left side.      Patellar reflexes are 2+ on the right side and 2+ on the left side.      Achilles reflexes are 2+ on the right side and 2+ on the left  side. Skin: Skin is warm and dry.  Psychiatric: She has a normal mood and affect. Her behavior is normal. Judgment and thought content normal.       Assessment & Plan:  Migraine without aura and without status migrainosus, not intractable - Plan: ketorolac (TORADOL) injection 60 mg  Nausea and vomiting, vomiting of unspecified type - Plan: ondansetron (ZOFRAN-ODT) disintegrating tablet 8 mg, ondansetron (ZOFRAN ODT) 4 MG disintegrating tablet   This is a typical migraine for the patient. We are going to treat today and give her nausea medication for future migraines.  Benny LennertSarah Yi Haugan PA-C  [facility] 01/14/2015 4:50 PM

## 2015-11-28 ENCOUNTER — Ambulatory Visit (INDEPENDENT_AMBULATORY_CARE_PROVIDER_SITE_OTHER): Payer: PRIVATE HEALTH INSURANCE | Admitting: Emergency Medicine

## 2015-11-28 VITALS — BP 120/86 | HR 80 | Temp 98.4°F | Resp 18 | Wt 158.0 lb

## 2015-11-28 DIAGNOSIS — G43009 Migraine without aura, not intractable, without status migrainosus: Secondary | ICD-10-CM

## 2015-11-28 MED ORDER — ELETRIPTAN HYDROBROMIDE 40 MG PO TABS: 40.0000 mg | ORAL_TABLET | ORAL | Status: AC | PRN

## 2015-11-28 MED ORDER — PROMETHAZINE HCL 25 MG/ML IJ SOLN
25.0000 mg | Freq: Once | INTRAMUSCULAR | Status: AC
  Administered 2015-11-28: 25 mg via INTRAMUSCULAR

## 2015-11-28 MED ORDER — KETOROLAC TROMETHAMINE 60 MG/2ML IM SOLN
60.0000 mg | Freq: Once | INTRAMUSCULAR | Status: AC
  Administered 2015-11-28: 60 mg via INTRAMUSCULAR

## 2015-11-28 NOTE — Patient Instructions (Signed)
Eletriptan tablets What is this medicine? ELETRIPTAN (el ih TRIP tan) is used to treat migraines with or without aura. An aura is a strange feeling or visual disturbance that warns you of an attack. It is not used to prevent migraines. This medicine may be used for other purposes; ask your health care provider or pharmacist if you have questions. What should I tell my health care provider before I take this medicine? They need to know if you have any of these conditions: -bowel disease or colitis -diabetes -family history of heart disease -fast or irregular heart beat -heart or blood vessel disease, angina (chest pain), or previous heart attack -high blood pressure -high cholesterol -history of stroke, transient ischemic attacks (TIAs or mini-strokes), or intracranial bleeding -kidney or liver disease -overweight -poor circulation -postmenopausal or surgical removal of uterus and ovaries -Raynaud's disease -seizure disorder -an unusual or allergic reaction to eletriptan, other medicines, foods, dyes, or preservatives -pregnant or trying to get pregnant -breast-feeding How should I use this medicine? Take this medicine by mouth with a glass of water. Follow the directions on the prescription label. This medicine is taken at the first symptoms of a migraine. It is not for everyday use. If your migraine headache returns after one dose, you can take another dose as directed. You must leave at least 2 hours between doses, and do not take more than 40 mg as a single dose. Do not take more than 80 mg total in any 24 hour period. If there is no improvement at all after the first dose, do not take a second dose without talking to your doctor or health care professional. Do not take your medicine more often than directed. Talk to your pediatrician regarding the use of this medicine in children. Special care may be needed. Overdosage: If you think you have taken too much of this medicine contact a  poison control center or emergency room at once. NOTE: This medicine is only for you. Do not share this medicine with others. What if I miss a dose? This does not apply; this medicine is not for regular use. What may interact with this medicine? Do not take this medicine with any of the following medications: -amiodarone -amphetamine, dextroamphetamine, or cocaine -aprepitant -certain antibiotics like clarithromycin, erythromycin, troleandomycin -cimetidine -conivaptan -dalfopristin; quinupristin -dihydroergotamine, ergotamine, ergoloid mesylates, methysergide, or ergot-type medication - do not take within 24 hours of taking eletriptan. -diltiazem -feverfew -imatinib -medicines for fungal infections like fluconazole, itraconazole, ketoconazole, and voriconazole -medicines for HIV, AIDS -medicines for mental depression like fluvoxamine and nefazodone -mifepristone -other migraine medicines like almotriptan, sumatriptan, naratriptan, rizatriptan, zolmitriptan - do not take within 24 hours of taking eletriptan. -tryptophan -verapamil This medicine may also interact with the following medications: -medicines for mental depression, anxiety or mood problems This list may not describe all possible interactions. Give your health care provider a list of all the medicines, herbs, non-prescription drugs, or dietary supplements you use. Also tell them if you smoke, drink alcohol, or use illegal drugs. Some items may interact with your medicine. What should I watch for while using this medicine? Only take this medicine for a migraine headache. Take it if you get warning symptoms or at the start of a migraine attack. It is not for regular use to prevent migraine attacks. You may get drowsy or dizzy. Do not drive, use machinery, or do anything that needs mental alertness until you know how this medicine affects you. To reduce dizzy or fainting spells, do not  sit or stand up quickly, especially if you  are an older patient. Alcohol can increase drowsiness, dizziness and flushing. Avoid alcoholic drinks. Smoking cigarettes may increase the risk of heart-related side effects from using this medicine. If you take migraine medicines for 10 or more days a month, your migraines may get worse. Keep a diary of headache days and medicine use. Contact your healthcare professional if your migraine attacks occur more frequently. What side effects may I notice from receiving this medicine? Side effects that you should report to your doctor or health care professional as soon as possible: -allergic reactions like skin rash, itching or hives, swelling of the face, lips, or tongue -fast, slow, or irregular heart beat -increased or decreased blood pressure -seizures -severe stomach pain and cramping, bloody diarrhea -signs and symptoms of a blood clot such as breathing problems; changes in vision; chest pain; severe, sudden headache; pain, swelling, warmth in the leg; trouble speaking; sudden numbness or weakness of the face, arm or leg -tingling, pain, or numbness in the face, hands, or feet Side effects that usually do not require medical attention (report to your doctor or health care professional if they continue or are bothersome): -drowsiness -feeling warm, flushing, or redness of the face -headache -muscle cramps, pain -nausea, vomiting -unusually weak or tired This list may not describe all possible side effects. Call your doctor for medical advice about side effects. You may report side effects to FDA at 1-800-FDA-1088. Where should I keep my medicine? Keep out of the reach of children. Store at room temperature between 15 and 30 degrees C (59 and 86 degrees F). Throw away any unused medicine after the expiration date. NOTE: This sheet is a summary. It may not cover all possible information. If you have questions about this medicine, talk to your doctor, pharmacist, or health care provider.     2016, [employer]. (2013-08-12 10:22:32)

## 2015-11-28 NOTE — Progress Notes (Signed)
Subjective:  Patient ID: Breanna Swanson, female    DOB: 02/24/1956  Age: 59 y.o. MRN: [id number]  CC: Migraine   HPI Breanna Swanson presents   She has a history of migraine headaches. She is under the impression she can only take 2 Relpax tablets per headache. She's had a headache has been persistent since Thursday that she gets a headache like this about twice a year. She took 2 Relpax for this headache and on both occasions she says the headache is  Been relieved and then will return. She has no nausea or vomiting today she is photophobic. She has no antecedent no was or head injury  History Breanna DavenportSandra has a past medical history of Hyperlipidemia.   She has past surgical history that includes Cystectomy.   Her  family history is not on file.  She   reports that she has never smoked. She has never used smokeless tobacco. She reports that she drinks alcohol. She reports that she does not use illicit drugs.  Outpatient Prescriptions Prior to Visit  Medication Sig Dispense Refill  . atorvastatin (LIPITOR) 10 MG tablet Take 5 mg by mouth daily.    Marland Kitchen. buPROPion (WELLBUTRIN) 100 MG tablet Take 100 mg by mouth 2 (two) times daily.    . ondansetron (ZOFRAN ODT) 4 MG disintegrating tablet Take 1-2 tablets (4-8 mg total) by mouth every 8 (eight) hours as needed for nausea. 20 tablet 0  . sertraline (ZOLOFT) 100 MG tablet Take 100 mg by mouth daily.    Marland Kitchen. eletriptan (RELPAX) 40 MG tablet Take 40 mg by mouth as needed for migraine or headache. One tablet by mouth at onset of headache. May repeat in 2 hours if headache persists or recurs.    . ibandronate (BONIVA) 150 MG tablet Take 150 mg by mouth every 30 (thirty) days. Take in the morning with a full glass of water, on an empty stomach, and do not take anything else by mouth or lie down for the next 30 min.     No facility-administered medications prior to visit.    Social History   Social History  . Marital Status: Single    Spouse Name:  N/A  . Number of Children: N/A  . Years of Education: N/A   Social History Main Topics  . Smoking status: Never Smoker   . Smokeless tobacco: Never Used  . Alcohol Use: Yes     Comment: social   . Drug Use: No  . Sexual Activity: Not Asked   Other Topics Concern  . None   Social History Narrative     Review of Systems  Constitutional: Negative for fever, chills and appetite change.  HENT: Negative for congestion, ear pain, postnasal drip, sinus pressure and sore throat.   Eyes: Positive for photophobia. Negative for pain and redness.  Respiratory: Negative for cough, shortness of breath and wheezing.   Cardiovascular: Negative for leg swelling.  Gastrointestinal: Positive for nausea and vomiting. Negative for abdominal pain, diarrhea, constipation and blood in stool.  Endocrine: Negative for polyuria.  Genitourinary: Negative for dysuria, urgency, frequency and flank pain.  Musculoskeletal: Negative for gait problem.  Skin: Negative for rash.  Neurological: Positive for headaches. Negative for weakness.  Psychiatric/Behavioral: Negative for confusion and decreased concentration. The patient is not nervous/anxious.     Objective:  BP 120/86 mmHg  Pulse 80  Temp(Src) 98.4 F (36.9 C) (Oral)  Resp 18  Wt 158 lb (71.668 kg)  SpO2 99%  Physical  Exam  Constitutional: She is oriented to person, place, and time. She appears well-developed and well-nourished. No distress.  HENT:  Head: Normocephalic and atraumatic.  Right Ear: External ear normal.  Left Ear: External ear normal.  Nose: Nose normal.  Eyes: Conjunctivae and EOM are normal. Pupils are equal, round, and reactive to light. No scleral icterus.  Neck: Normal range of motion. Neck supple. No tracheal deviation present.  Cardiovascular: Normal rate, regular rhythm and normal heart sounds.   Pulmonary/Chest: Effort normal. No respiratory distress. She has no wheezes. She has no rales.  Abdominal: She exhibits no  mass. There is no tenderness. There is no rebound and no guarding.  Musculoskeletal: She exhibits no edema.  Lymphadenopathy:    She has no cervical adenopathy.  Neurological: She is alert and oriented to person, place, and time. Coordination normal.  Skin: Skin is warm and dry. No rash noted.  Psychiatric: She has a normal mood and affect. Her behavior is normal.      Assessment & Plan:   Breanna Swanson was seen today for migraine.  Diagnoses and all orders for this visit:  Migraine without aura and without status migrainosus, not intractable -     ketorolac (TORADOL) injection 60 mg; Inject 2 mLs (60 mg total) into the muscle once. -     promethazine (PHENERGAN) injection 25 mg; Inject 1 mL (25 mg total) into the muscle once.  Other orders -     eletriptan (RELPAX) 40 MG tablet; Take 1 tablet (40 mg total) by mouth as needed for migraine or headache. One tablet by mouth at onset of headache. May repeat in 2 hours if headache persists or recurs.   I have changed Breanna Swanson's eletriptan. I am also having her maintain her sertraline, atorvastatin, buPROPion, ibandronate, and ondansetron. We administered ketorolac and promethazine.  Meds ordered this encounter  Medications  . ketorolac (TORADOL) injection 60 mg    Sig:   . promethazine (PHENERGAN) injection 25 mg    Sig:   . eletriptan (RELPAX) 40 MG tablet    Sig: Take 1 tablet (40 mg total) by mouth as needed for migraine or headache. One tablet by mouth at onset of headache. May repeat in 2 hours if headache persists or recurs.    Dispense:  10 tablet    Refill:  2    I educated her by the proper dosing of Relpax and suggested she talk to her regular doctor but  Appropriate red flag conditions were discussed with the patient as well as actions that should be taken.  Patient expressed his understanding.  Follow-up: No Follow-up on file.  Carmelina Dane, MD

## 2019-05-07 ENCOUNTER — Other Ambulatory Visit: Payer: Self-pay

## 2019-05-07 ENCOUNTER — Ambulatory Visit: Payer: Self-pay

## 2019-05-07 ENCOUNTER — Other Ambulatory Visit: Payer: Self-pay | Admitting: Family Medicine

## 2019-05-07 DIAGNOSIS — M25572 Pain in left ankle and joints of left foot: Secondary | ICD-10-CM

## 2019-11-11 ENCOUNTER — Ambulatory Visit: Payer: PRIVATE HEALTH INSURANCE | Admitting: Podiatry

## 2020-01-05 IMAGING — DX LEFT ANKLE COMPLETE - 3+ VIEW
3 series · 3 of 3 positions shown · non-contrast
Comparison: None.

CLINICAL DATA: Left ankle pain

EXAM:
LEFT ANKLE COMPLETE - 3+ VIEW

[ankle ap]
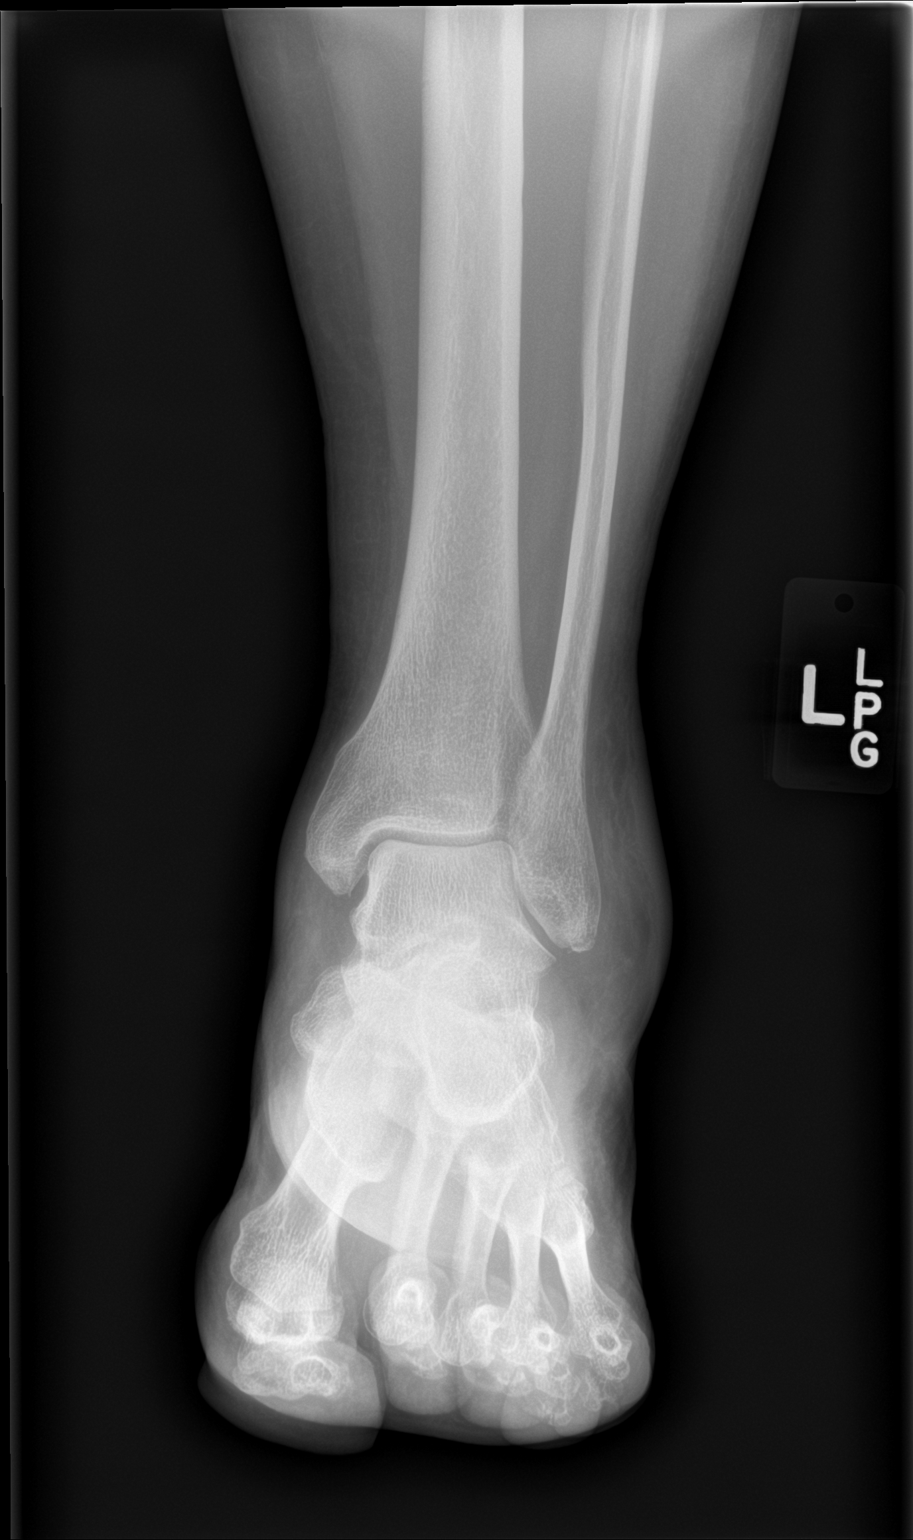

[ankle mortise]
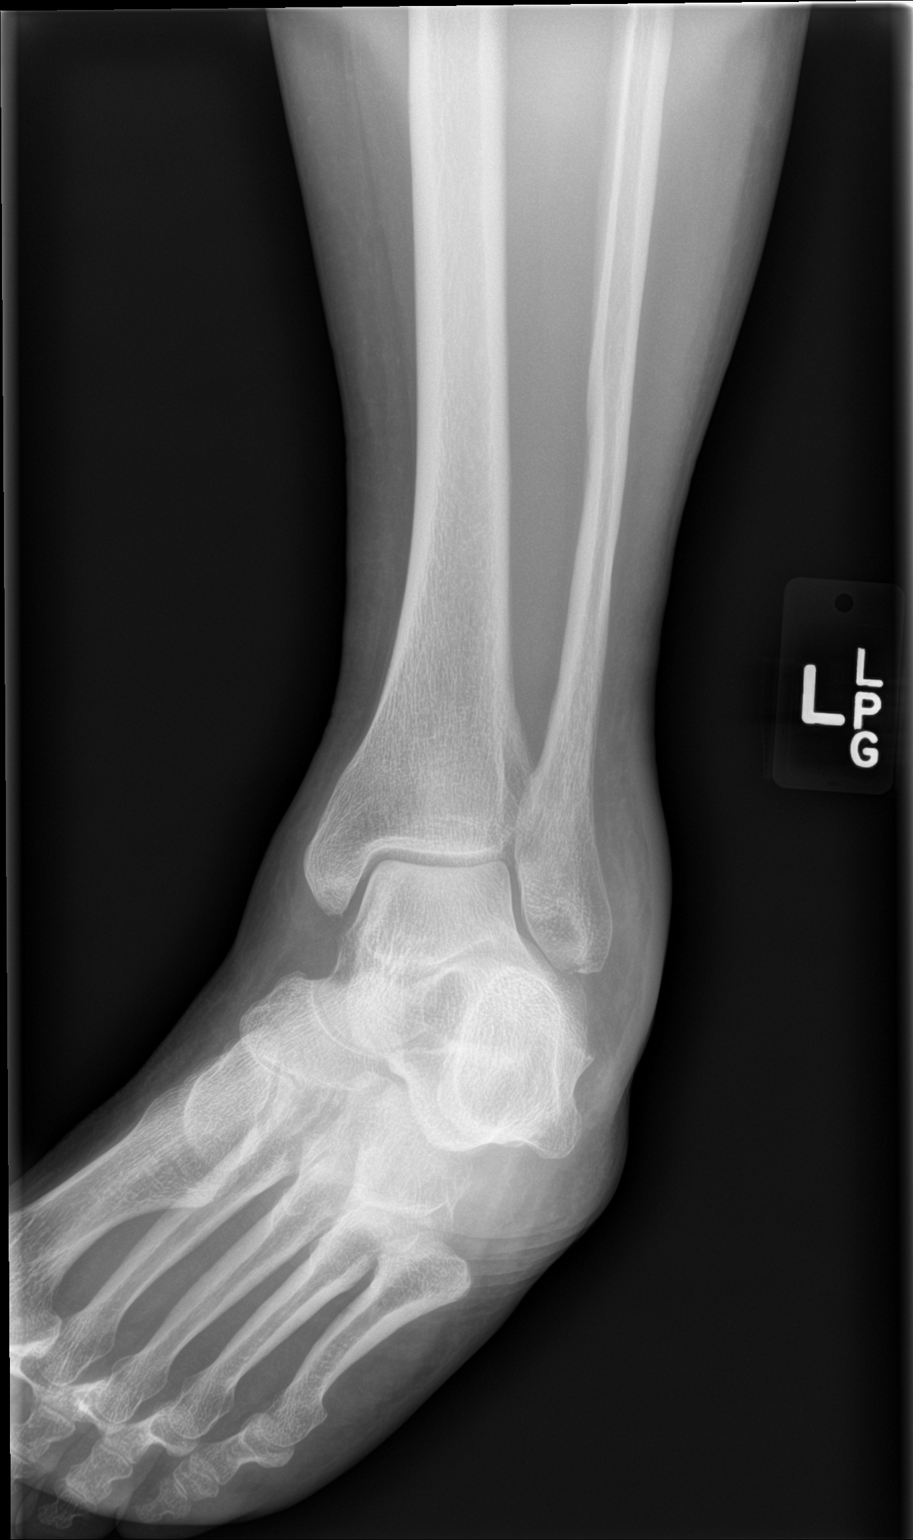

[ankle lat]
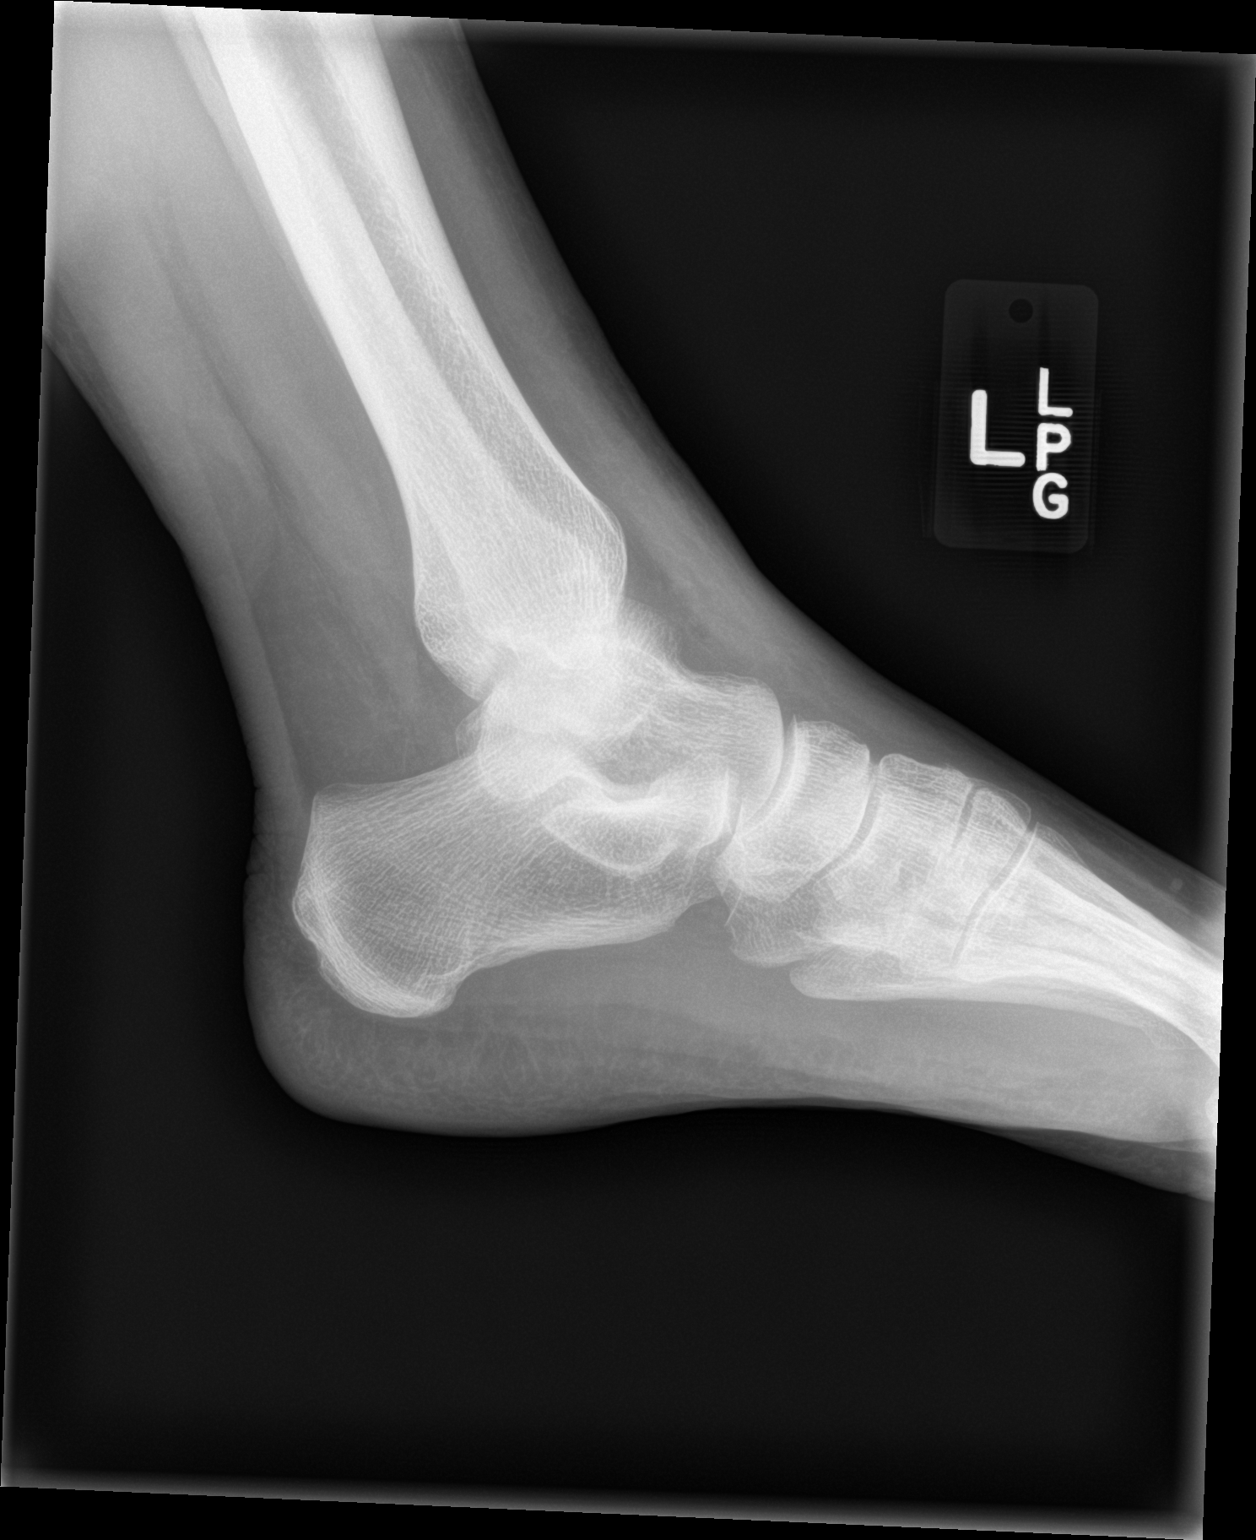

[3 of 3 positions shown; findings below may reference images not displayed]

FINDINGS: There is extensive soft tissue swelling about the lateral malleolus.
There is a small nondisplaced avulsion fracture arising from the
distal tip of the lateral malleolus. There is no radiopaque foreign
body. There is no dislocation.
IMPRESSION: Small minimally displaced avulsion fracture arising from the distal
tip of lateral malleolus with associated surrounding soft tissue
swelling.
